# Patient Record
Sex: Female | Born: 1995 | Race: White | Hispanic: No | Marital: Single
Health system: Southern US, Community
[De-identification: ages and names within clinical notes are randomized; demographics above are authoritative.]

## PROBLEM LIST (undated history)

## (undated) DIAGNOSIS — J019 Acute sinusitis, unspecified: Secondary | ICD-10-CM

## (undated) DIAGNOSIS — J0391 Acute recurrent tonsillitis, unspecified: Secondary | ICD-10-CM

---
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
## 2016-01-26 ENCOUNTER — Inpatient Hospital Stay: Payer: BLUE CROSS/BLUE SHIELD

## 2016-01-26 LAB — POC URINE PREGNANCY TEST: PREGNANCY TEST, URINE: NEGATIVE

## 2016-01-26 MED ORDER — MIDAZOLAM 1 MG/ML IJ SOLN
1 mg/mL | INTRAMUSCULAR | Status: DC | PRN
Start: 2016-01-26 — End: 2016-01-26
  Administered 2016-01-26: 11:00:00 via INTRAVENOUS

## 2016-01-26 MED ORDER — BUPIVACAINE-EPINEPHRINE (PF) 0.25 %-1:200,000 IJ SOLN
0.25 %-1:200,000 | INTRAMUSCULAR | Status: DC | PRN
Start: 2016-01-26 — End: 2016-01-26
  Administered 2016-01-26: 12:00:00 via SUBCUTANEOUS

## 2016-01-26 MED ORDER — ROCURONIUM 10 MG/ML IV
10 mg/mL | INTRAVENOUS | Status: DC | PRN
Start: 2016-01-26 — End: 2016-01-26
  Administered 2016-01-26: 12:00:00 via INTRAVENOUS

## 2016-01-26 MED ORDER — SUCCINYLCHOLINE CHLORIDE 20 MG/ML INJECTION
20 mg/mL | INTRAMUSCULAR | Status: DC | PRN
Start: 2016-01-26 — End: 2016-01-26
  Administered 2016-01-26: 12:00:00 via INTRAVENOUS

## 2016-01-26 MED ORDER — LIDOCAINE (PF) 10 MG/ML (1 %) IJ SOLN
10 mg/mL (1 %) | Freq: Once | INTRAMUSCULAR | Status: DC | PRN
Start: 2016-01-26 — End: 2016-01-26

## 2016-01-26 MED ORDER — PROMETHAZINE 25 MG/ML INJECTION
25 mg/mL | INTRAMUSCULAR | Status: DC | PRN
Start: 2016-01-26 — End: 2016-01-26

## 2016-01-26 MED ORDER — ESMOLOL 10 MG/ML IV SOLN
100 mg/10 mL (10 mg/mL) | INTRAVENOUS | Status: DC | PRN
Start: 2016-01-26 — End: 2016-01-26
  Administered 2016-01-26 (×2): via INTRAVENOUS

## 2016-01-26 MED ORDER — ONDANSETRON (PF) 4 MG/2 ML INJECTION
4 mg/2 mL | Freq: Once | INTRAMUSCULAR | Status: AC
Start: 2016-01-26 — End: 2016-01-26
  Administered 2016-01-26: 11:00:00 via INTRAVENOUS

## 2016-01-26 MED ORDER — LABETALOL 5 MG/ML IV SOLN
5 mg/mL | INTRAVENOUS | Status: DC | PRN
Start: 2016-01-26 — End: 2016-01-26

## 2016-01-26 MED ORDER — MEPERIDINE (PF) 25 MG/ML INJ SOLUTION
25 mg/ml | INTRAMUSCULAR | Status: AC | PRN
Start: 2016-01-26 — End: 2016-01-26

## 2016-01-26 MED ORDER — HYDRALAZINE 20 MG/ML IJ SOLN
20 mg/mL | INTRAMUSCULAR | Status: DC | PRN
Start: 2016-01-26 — End: 2016-01-26

## 2016-01-26 MED ORDER — FENTANYL CITRATE (PF) 50 MCG/ML IJ SOLN
50 mcg/mL | INTRAMUSCULAR | Status: DC | PRN
Start: 2016-01-26 — End: 2016-01-26
  Administered 2016-01-26 (×3): via INTRAVENOUS

## 2016-01-26 MED ORDER — GLYCOPYRROLATE 0.2 MG/ML IJ SOLN
0.2 mg/mL | Freq: Once | INTRAMUSCULAR | Status: AC
Start: 2016-01-26 — End: 2016-01-26
  Administered 2016-01-26: 11:00:00 via INTRAVENOUS

## 2016-01-26 MED ORDER — PROPOFOL 10 MG/ML IV EMUL
10 mg/mL | INTRAVENOUS | Status: DC | PRN
Start: 2016-01-26 — End: 2016-01-26
  Administered 2016-01-26 (×2): via INTRAVENOUS

## 2016-01-26 MED ORDER — LACTATED RINGERS IV
INTRAVENOUS | Status: DC
Start: 2016-01-26 — End: 2016-01-26
  Administered 2016-01-26 (×3): via INTRAVENOUS

## 2016-01-26 MED ORDER — LIDOCAINE (PF) 20 MG/ML (2 %) IJ SOLN
20 mg/mL (2 %) | INTRAMUSCULAR | Status: DC | PRN
Start: 2016-01-26 — End: 2016-01-26
  Administered 2016-01-26: 12:00:00 via INTRAVENOUS

## 2016-01-26 MED ORDER — FLUMAZENIL 0.1 MG/ML IV SOLN
0.1 mg/mL | INTRAVENOUS | Status: DC | PRN
Start: 2016-01-26 — End: 2016-01-26

## 2016-01-26 MED ORDER — DIPHENHYDRAMINE HCL 50 MG/ML IJ SOLN
50 mg/mL | INTRAMUSCULAR | Status: DC | PRN
Start: 2016-01-26 — End: 2016-01-26
  Administered 2016-01-26: 12:00:00 via INTRAVENOUS

## 2016-01-26 MED ORDER — NALOXONE 0.4 MG/ML INJECTION
0.4 mg/mL | INTRAMUSCULAR | Status: DC | PRN
Start: 2016-01-26 — End: 2016-01-26

## 2016-01-26 MED ORDER — DEXAMETHASONE SODIUM PHOSPHATE 10 MG/ML IJ SOLN
10 mg/mL | Freq: Once | INTRAMUSCULAR | Status: AC
Start: 2016-01-26 — End: 2016-01-26
  Administered 2016-01-26: 11:00:00 via INTRAVENOUS

## 2016-01-26 MED ORDER — DIPHENHYDRAMINE HCL 50 MG/ML IJ SOLN
50 mg/mL | INTRAMUSCULAR | Status: DC | PRN
Start: 2016-01-26 — End: 2016-01-26

## 2016-01-26 MED ORDER — OXYCODONE-ACETAMINOPHEN 5 MG-325 MG TAB
5-325 mg | ORAL | Status: DC | PRN
Start: 2016-01-26 — End: 2016-01-26

## 2016-01-26 MED ORDER — HYDROCODONE-ACETAMINOPHEN 7.5 MG-325 MG/15 ML ORAL SOLN
ORAL | Status: DC | PRN
Start: 2016-01-26 — End: 2016-01-26
  Administered 2016-01-26: 13:00:00 via ORAL

## 2016-01-26 MED ORDER — FENTANYL CITRATE (PF) 50 MCG/ML IJ SOLN
50 mcg/mL | INTRAMUSCULAR | Status: DC | PRN
Start: 2016-01-26 — End: 2016-01-26

## 2016-01-26 MED FILL — PROMETHAZINE 25 MG/ML INJECTION: 25 mg/mL | INTRAMUSCULAR | Qty: 0.25

## 2016-01-26 MED FILL — DEXAMETHASONE SODIUM PHOSPHATE 10 MG/ML IJ SOLN: 10 mg/mL | INTRAMUSCULAR | Qty: 0.8

## 2016-01-26 MED FILL — FLUMAZENIL 0.1 MG/ML IV SOLN: 0.1 mg/mL | INTRAVENOUS | Qty: 2

## 2016-01-26 MED FILL — LACTATED RINGERS IV: INTRAVENOUS | Qty: 1000

## 2016-01-26 MED FILL — GLYCOPYRROLATE 0.2 MG/ML IJ SOLN: 0.2 mg/mL | INTRAMUSCULAR | Qty: 1

## 2016-01-26 MED FILL — LABETALOL 5 MG/ML IV SOLN: 5 mg/mL | INTRAVENOUS | Qty: 2

## 2016-01-26 MED FILL — DIPHENHYDRAMINE HCL 50 MG/ML IJ SOLN: 50 mg/mL | INTRAMUSCULAR | Qty: 0.25

## 2016-01-26 MED FILL — FENTANYL CITRATE (PF) 50 MCG/ML IJ SOLN: 50 mcg/mL | INTRAMUSCULAR | Qty: 0.5

## 2016-01-26 MED FILL — DEMEROL (PF) 25 MG/ML INJECTION SYRINGE: 25 mg/mL | INTRAMUSCULAR | Qty: 1

## 2016-01-26 MED FILL — ONDANSETRON (PF) 4 MG/2 ML INJECTION: 4 mg/2 mL | INTRAMUSCULAR | Qty: 2

## 2016-01-26 MED FILL — NALOXONE 0.4 MG/ML INJECTION: 0.4 mg/mL | INTRAMUSCULAR | Qty: 0.25

## 2016-01-26 MED FILL — HYDRALAZINE 20 MG/ML IJ SOLN: 20 mg/mL | INTRAMUSCULAR | Qty: 0.25

## 2016-01-26 MED FILL — OXYCODONE-ACETAMINOPHEN 5 MG-325 MG TAB: 5-325 mg | ORAL | Qty: 2

## 2016-01-26 MED FILL — XYLOCAINE-MPF 10 MG/ML (1 %) INJECTION SOLUTION: 10 mg/mL (1 %) | INTRAMUSCULAR | Qty: 1

## 2016-01-26 MED FILL — HYDROCODONE-ACETAMINOPHEN 7.5 MG-325 MG/15 ML ORAL SOLN: ORAL | Qty: 30

## 2016-01-26 NOTE — Other (Signed)
[id number].  Awake and alert. C/o of feeling cool.  Heat lamps applied.

## 2016-01-26 NOTE — Anesthesia Post-Procedure Evaluation (Signed)
Post-Anesthesia Evaluation and Assessment    Patient: Linda PitcherRachel J Manning MRN: [id number]  SSN: [id number]    Date of Birth: May 05, 1996  Age: 20 y.o.  Sex: female       Cardiovascular Function/Vital Signs  Visit Vitals   ??? BP 112/76   ??? Pulse 70   ??? Temp 36.7 ??C (98.1 ??F)   ??? Resp 16   ??? Ht 5\' 5"  (1.651 m)   ??? Wt 53 kg (116 lb 13.5 oz)   ??? SpO2 100%   ??? BMI 19.44 kg/m2       Patient is status post general anesthesia for Procedure(s):  TONSILLECTOMY.    Nausea/Vomiting: None    Postoperative hydration reviewed and adequate.    Pain:  Pain Scale 1: Numeric (0 - 10) (01/26/16 1005)  Pain Intensity 1: 3 (01/26/16 1005)   Managed    Neurological Status:   Neuro (WDL): Within Defined Limits (01/26/16 0946)   At baseline    Mental Status and Level of Consciousness: Arousable    Pulmonary Status:   O2 Device: Room air (01/26/16 1005)   Adequate oxygenation and airway patent    Complications related to anesthesia: None    Post-anesthesia assessment completed. No concerns    Signed By: Alric SetonMelvin C Marget Outten, MD     Jan 26, 2016

## 2016-01-26 NOTE — H&P (Signed)
Pre-op History and Physical        Otolaryngology    Patient: Linda PitcherRachel J Manning MRN: [id number]  SSN: [id number]    Date of Birth: 17-Aug-1996  Age: 20 y.o.  Sex: female       01/26/2016    Assessment:     Linda Manning is a 20 y.o. female who is presenting with recurrent acute tonsillitis and chronic tonsillitis.       HPI:   20 y.o. female who is presenting with recurrent acute tonsillitis and chronic tonsillitis.       Allergies:   Not on File    Medications:    No current facility-administered medications for this encounter.     Past Medical History:   No past medical history on file.    Past Surgical History:   No past surgical history on file.    Family History:   No family history on file.    Social History:     Social History   Substance Use Topics   ??? Smoking status: Not on file   ??? Smokeless tobacco: Not on file   ??? Alcohol use Not on file       Review of Systems:   Negative except sore throat.    Physical Exam:   There were no vitals taken for this visit.    General appearance: alert, cooperative, no distress, appears stated age  Head: Normocephalic, without obvious abnormality, atraumatic  Ears: normal TM's and external ear canals AU  Nose: Nares normal. Septum midline. Mucosa normal. No drainage or sinus tenderness.  Throat: Lips, mucosa, and tongue normal. Teeth and gums normal  Neck: no adenopathy, no mass  Lungs: No wheeze  Heart: No cyanosis      Labs:         Imaging:   @IMAGES @        Impression:     20 y.o. female who is presenting with recurrent acute tonsillitis and chronic tonsillitis.       Plan:     1)Tonsillectomy          Glendora Scoreyan P Citlalli Weikel, MD  @[facility] 

## 2016-01-26 NOTE — Op Note (Signed)
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    ANESTHESIA: General    COMPLICATIONS:  None    BLOOD LOSS:  Minimal     FINDINGS: chronic tonsillitis    INDICATIONS FOR SURGERY:  The patient is a 20 y.o. female with a history of chronic and recurrent tonsillitis.    PROCEDURE DETAILS:  After informed consent, the patient was taken to the operating room and placed on the table in the supine position.  After general anesthesia, the table was turned 90 degrees and the patient was prepped and draped in the standard fashion.  A Crowe-Davis mouth retractor was inserted into the mouth, opened, and suspended from the Mayo stand.  Using an Allis clamp, the right tonsil was retracted toward the midline.  The bovie electrocautery, at the appropriate setting, was used to enter the tonsillar capsule through the anterior pillar and the capsule was followed around the tonsil while retracting the tonsil medially to save the posterior pillar mucosa.   Minor bleeding was controlled with the coagulation function of the Bovie.  The left tonsil was removed in the same fashion.     Conservative prophylactic cautery was performed in both poles bilaterally.  There was no bleeding seen at this point.  At this point the oropharynx was irrigated with normal saline.  The mouth retractor was taken down for a short period and then re-suspended.  No bleeding was seen at this point.  I then injected 10cc of 0.25% plain marcaine into the tonsillar fossae. I then evacuated the stomach with an orogastric tube. The patient was then awakened from anesthesia, extubated,  and taken to the PACU in stable condition.  There were no complications.  The patient tolerated the procedure well.    Glendora Scoreyan P Zamire Whitehurst, MD  01/26/2016  8:02 AM

## 2016-01-26 NOTE — Anesthesia Pre-Procedure Evaluation (Signed)
Anesthetic History   No history of anesthetic complications            Review of Systems / Medical History  Patient summary reviewed, nursing notes reviewed and pertinent labs reviewed    Pulmonary  Within defined limits                 Neuro/Psych   Within defined limits           Cardiovascular  Within defined limits                     GI/Hepatic/Renal  Within defined limits              Endo/Other  Within defined limits           Other Findings              Physical Exam    Airway  Mallampati: I  TM Distance: 4 - 6 cm  Neck ROM: normal range of motion   Mouth opening: Normal     Cardiovascular  Regular rate and rhythm,  S1 and S2 normal,  no murmur, click, rub, or gallop             Dental  No notable dental hx       Pulmonary  Breath sounds clear to auscultation               Abdominal  GI exam deferred       Other Findings            Anesthetic Plan    ASA: 1  Anesthesia type: general          Induction: Intravenous  Anesthetic plan and risks discussed with: Patient

## 2016-01-26 NOTE — Other (Signed)
0900 Patients mother, Clement HusbandsJoyce Speers, updated in waiting room.

## 2016-01-26 NOTE — Op Note (Signed)
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    ANESTHESIA: General    COMPLICATIONS:  None    BLOOD LOSS:  Minimal     FINDINGS: chronic tonsillitis    INDICATIONS FOR SURGERY:  The patient is a 20 y.o. female with a history of chronic and recurrent tonsillitis.    PROCEDURE DETAILS:  After informed consent, the patient was taken to the operating room and placed on the table in the supine position.  After general anesthesia, the table was turned 90 degrees and the patient was prepped and draped in the standard fashion.  A Crowe-Davis mouth retractor was inserted into the mouth, opened, and suspended from the Mayo stand.  Using an Allis clamp, the right tonsil was retracted toward the midline.  The bovie electrocautery, at the appropriate setting, was used to enter the tonsillar capsule through the anterior pillar and the capsule was followed around the tonsil while retracting the tonsil medially to save the posterior pillar mucosa.   Minor bleeding was controlled with the coagulation function of the Bovie.  The left tonsil was removed in the same fashion.     Conservative prophylactic cautery was performed in both poles bilaterally.  There was no bleeding seen at this point.  At this point the oropharynx was irrigated with normal saline.  The mouth retractor was taken down for a short period and then re-suspended.  No bleeding was seen at this point.  I then injected 10cc of 0.25% plain marcaine into the tonsillar fossae. I then evacuated the stomach with an orogastric tube. The patient was then awakened from anesthesia, extubated, and taken to the PACU in stable condition.   There were no complications.  The patient tolerated the procedure well.    Glendora Scoreyan P Bereket Gernert, MD  01/26/2016  8:02 AM

## 2018-03-22 ENCOUNTER — Emergency Department (HOSPITAL_COMMUNITY): Payer: BLUE CROSS/BLUE SHIELD

## 2018-03-22 ENCOUNTER — Emergency Department (HOSPITAL_COMMUNITY)
Admission: EM | Admit: 2018-03-22 | Discharge: 2018-03-22 | Disposition: A | Discharge status: Home / Self Care | Payer: BLUE CROSS/BLUE SHIELD | Attending: Emergency Medicine | Admitting: Emergency Medicine

## 2018-03-22 ENCOUNTER — Encounter (HOSPITAL_COMMUNITY): Payer: Self-pay

## 2018-03-22 ENCOUNTER — Other Ambulatory Visit: Payer: Self-pay

## 2018-03-22 DIAGNOSIS — Y93E1 Activity, personal bathing and showering: Secondary | ICD-10-CM | POA: Diagnosis not present

## 2018-03-22 DIAGNOSIS — Y92002 Bathroom of unspecified non-institutional (private) residence single-family (private) house as the place of occurrence of the external cause: Secondary | ICD-10-CM | POA: Diagnosis not present

## 2018-03-22 DIAGNOSIS — S91115A Laceration without foreign body of left lesser toe(s) without damage to nail, initial encounter: Secondary | ICD-10-CM | POA: Diagnosis not present

## 2018-03-22 DIAGNOSIS — S6982XA Other specified injuries of left wrist, hand and finger(s), initial encounter: Secondary | ICD-10-CM | POA: Diagnosis present

## 2018-03-22 DIAGNOSIS — S61512A Laceration without foreign body of left wrist, initial encounter: Secondary | ICD-10-CM | POA: Insufficient documentation

## 2018-03-22 DIAGNOSIS — W25XXXA Contact with sharp glass, initial encounter: Secondary | ICD-10-CM | POA: Diagnosis not present

## 2018-03-22 DIAGNOSIS — Y999 Unspecified external cause status: Secondary | ICD-10-CM | POA: Diagnosis not present

## 2018-03-22 MED ORDER — CEPHALEXIN 250 MG PO CAPS: 250.0000 mg | ORAL_CAPSULE | Freq: Two times a day (BID) | ORAL | 0 refills | Status: AC

## 2018-03-22 MED ORDER — LIDOCAINE HCL (PF) 1 % IJ SOLN
30.0000 mL | Freq: Once | INTRAMUSCULAR | Status: AC
  Administered 2018-03-22: 30 mL

## 2018-03-22 NOTE — ED Notes (Signed)
Pt ready for discharge VS documented 

## 2018-03-22 NOTE — Discharge Instructions (Addendum)
1. Medications: Tylenol or ibuprofen for pain. You may take up to 800 mg (4 pills) at a time of ibuprofen, 3 times a day with meals. Take antibiotics as prescribed.  2. Treatment: ice for swelling, keep wound clean with warm soap and water and keep bandage dry 3. Follow Up: Please return in 10 days to have your stitches/staples removed or sooner if you have concerns. Return to the emergency department for increased redness, drainage of pus from the wound   WOUND CARE  Remove bandage and wash wound gently with mild soap and warm water daily. Reapply a new bandage after cleaning wound.   Do not apply any ointments or creams to the wound while stitches are in place, as this may cause delayed healing. Return if you experience any of the following signs of infection: Swelling, redness, pus drainage, streaking, fever >101.0 F  Return if you experience excessive bleeding that does not stop after 15-20 minutes of constant, firm pressure.

## 2018-03-22 NOTE — ED Notes (Signed)
Ortho paged for post op shoe due to size not available in ED stock room

## 2018-03-22 NOTE — ED Provider Notes (Signed)
[facility]ENC[facility] Provider Note   CSN: [id number] Arrival date & time: 03/22/18  1907     History   Chief Complaint Chief Complaint  Patient presents with  . Laceration    HPI Lydia Smith is a 22 y.o. female presenting for evaluation of wrist and toe laceration.  Patient states she was getting out of shower when the last door went off the track and shattered on the floor.  She obtained a laceration of her left radial wrist and her left pinky toe.  She was seen at urgent care, and sent to the emergency room for further evaluation.  She reports continued numbness of her distal left pinky toe.  She denies numbness or difficulty moving her left hand.  She has no medical problems, takes no medications daily.  She is not on blood thinners.  Tetanus was updated at urgent care.  She denies significant pain at this time.  She denies injury elsewhere.  HPI  History reviewed. No pertinent past medical history.  There are no active problems to display for this patient.   History reviewed. No pertinent surgical history.   OB History    Gravida  1   Para      Term      Preterm      AB      Living        SAB      TAB      Ectopic      Multiple      Live Births               Home Medications    Prior to Admission medications   Medication Sig Start Date End Date Taking? Authorizing Provider  cephALEXin (KEFLEX) 250 MG capsule Take 1 capsule (250 mg total) by mouth 2 (two) times daily for 3 days. 03/22/18 03/25/18  Keylee Shrestha, PA-C    Family History No family history on file.  Social History Social History   Tobacco Use  . Smoking status: Not on file  Substance Use Topics  . Alcohol use: Not on file  . Drug use: Not on file     Allergies   Amoxicillin   Review of Systems Review of Systems  Skin: Positive for wound.  Neurological: Negative for numbness.  Hematological: Does not bruise/bleed easily.      Physical Exam Updated Vital Signs BP 110/68   Pulse 80   Temp 99.1 F (37.3 C) (Oral)   Resp 18   Ht 5' 5.5" (1.664 m)   Wt 55.3 kg (122 lb)   LMP 03/08/2018 (Exact Date)   SpO2 97%   BMI 19.99 kg/m   Physical Exam  Constitutional: She is oriented to person, place, and time. She appears well-developed and well-nourished. No distress.  HENT:  Head: Normocephalic and atraumatic.  Eyes: EOM are normal.  Neck: Normal range of motion.  Pulmonary/Chest: Effort normal.  Abdominal: She exhibits no distension.  Musculoskeletal: Normal range of motion. She exhibits tenderness.  Full active range of motion of the left hand without difficulty.  No pain at the anatomic snuffbox.  Small puncture wound at the distal aspect of the radius.  Small contusion underlying the laceration.  No lacerations noted elsewhere in the hand.  No active bleeding. 1 cm laceration of the medial aspect of the left fifth toe without active bleeding overlying the IP joint.  No active bleeding.  Patient reports decreased sensation of the distal toe.  Good cap refill.  No difficulty with abduction of the toe  Neurological: She is alert and oriented to person, place, and time.  Skin: Skin is warm. Capillary refill takes less than 2 seconds. No rash noted.  Psychiatric: She has a normal mood and affect.  Nursing note and vitals reviewed.    ED Treatments / Results  Labs (all labs ordered are listed, but only abnormal results are displayed) Labs Reviewed - No data to display  EKG None  Radiology Dg Wrist Complete Left  Result Date: 03/22/2018 CLINICAL DATA:  Laceration of the left wrist. EXAM: LEFT WRIST - COMPLETE 3+ VIEW COMPARISON:  None. FINDINGS: There is no evidence of fracture or dislocation. There is no evidence of arthropathy or other focal bone abnormality. Soft tissues are unremarkable. IMPRESSION: Negative. Electronically Signed   By: Sherian Rein M.D.   On: 03/22/2018 20:29   Dg Foot Complete  Left  Result Date: 03/22/2018 CLINICAL DATA:  Laceration to the dorsal is purpose of left fifth toe. EXAM: LEFT FOOT - COMPLETE 3+ VIEW COMPARISON:  None. FINDINGS: There is no evidence of fracture or dislocation. There is no evidence of arthropathy or other focal bone abnormality. Soft tissues are unremarkable. Overlying artifact is identified at the left fifth toe. IMPRESSION: Overlying artifact identified superficially of the left fifth toe. No bony abnormality noted. Electronically Signed   By: Sherian Rein M.D.   On: 03/22/2018 20:28    Procedures .Marland KitchenLaceration Repair Date/Time: 03/22/2018 10:22 PM Performed by: Alveria Apley, PA-C Authorized by: Alveria Apley, PA-C   Consent:    Consent obtained:  Verbal   Consent given by:  Patient   Risks discussed:  Infection, pain, poor cosmetic result and poor wound healing Anesthesia (see MAR for exact dosages):    Anesthesia method:  Local infiltration   Local anesthetic:  Lidocaine 1% w/o epi Laceration details:    Location:  Toe   Toe location:  L little toe   Length (cm):  1   Depth (mm):  4 Repair type:    Repair type:  Simple Pre-procedure details:    Preparation:  Patient was prepped and draped in usual sterile fashion and imaging obtained to evaluate for foreign bodies Exploration:    Wound exploration: wound explored through full range of motion and entire depth of wound probed and visualized     Wound extent: nerve damage     Wound extent: no foreign bodies/material noted, no tendon damage noted and no underlying fracture noted   Treatment:    Area cleansed with:  Shur-Clens   Amount of cleaning:  Extensive Skin repair:    Repair method:  Sutures   Suture size:  4-0   Suture material:  Prolene   Suture technique:  Simple interrupted   Number of sutures:  3 Approximation:    Approximation:  Close Post-procedure details:    Dressing: buddy tape.   Patient tolerance of procedure:  Tolerated well, no immediate  complications .Marland KitchenLaceration Repair Date/Time: 03/22/2018 10:23 PM Performed by: Alveria Apley, PA-C Authorized by: Alveria Apley, PA-C   Consent:    Consent obtained:  Verbal   Consent given by:  Patient   Risks discussed:  Pain, poor cosmetic result, poor wound healing and infection Anesthesia (see MAR for exact dosages):    Anesthesia method:  None Laceration details:    Location:  Hand   Hand location:  L wrist   Length (cm):  0.2   Laceration depth: puncture. Repair type:  Repair type:  Simple Pre-procedure details:    Preparation:  Imaging obtained to evaluate for foreign bodies Exploration:    Wound extent: no foreign bodies/material noted and no underlying fracture noted   Treatment:    Area cleansed with:  Shur-Clens   Amount of cleaning:  Extensive Skin repair:    Repair method:  Steri-Strips   Number of Steri-Strips:  3 Approximation:    Approximation:  Close Post-procedure details:    Dressing:  Sterile dressing   Patient tolerance of procedure:  Tolerated well, no immediate complications   (including critical care time)  Medications Ordered in ED Medications  lidocaine (PF) (XYLOCAINE) 1 % injection 30 mL (30 mLs Infiltration Given by Other 03/22/18 2158)     Initial Impression / Assessment and Plan / ED Course  I have reviewed the triage vital signs and the nursing notes.  Pertinent labs & imaging results that were available during my care of the patient were reviewed by me and considered in my medical decision making (see chart for details).     Patient presenting for evaluation of left wrist and left pinky wounds after the glass door broke.  Physical exam reassuring on the hand, patient is neurovascularly intact.  X-rays of hand and foot viewed interpreted by me, no fractures or dislocations.  Left little toe with decreased sensation, possible nerve injury.  ? Nerve damage when exploring the wound. However patient with good cap refill and no  underlying fracture.  As this is of the little toe, will have patient follow-up with podiatry instead of consulting tonight.  Wounds cleaned extensively and repaired.  Aftercare instructions given.  Will prophylactically placed on Keflex due to puncture wound and location of wounds.  At this time, patient appears safe for discharge.  Return precautions given.  Patient states she understands and agrees plan.   Final Clinical Impressions(s) / ED Diagnoses   Final diagnoses:  Laceration of left wrist, initial encounter  Laceration of lesser toe of left foot without foreign body without damage to nail, initial encounter    ED Discharge Orders        Ordered    cephALEXin (KEFLEX) 250 MG capsule  2 times daily     03/22/18 2156       Alveria Apley, PA-C 03/22/18 2226    Arby Barrette, MD 03/22/18 2242

## 2018-03-22 NOTE — ED Triage Notes (Signed)
Patient closed a glass shower door, came off the track and the door shattered. Patient has lac to left wrist and left foot. [facility] and was sent here for follow up (stitches, possible xray, specialist).

## 2019-12-18 IMAGING — CR DG FOOT COMPLETE 3+V*L*
3 series · 3 of 3 positions shown · non-contrast
Comparison: None.

CLINICAL DATA: Laceration to the dorsal is purpose of left fifth
toe.

EXAM:
LEFT FOOT - COMPLETE 3+ VIEW

[foot ap]
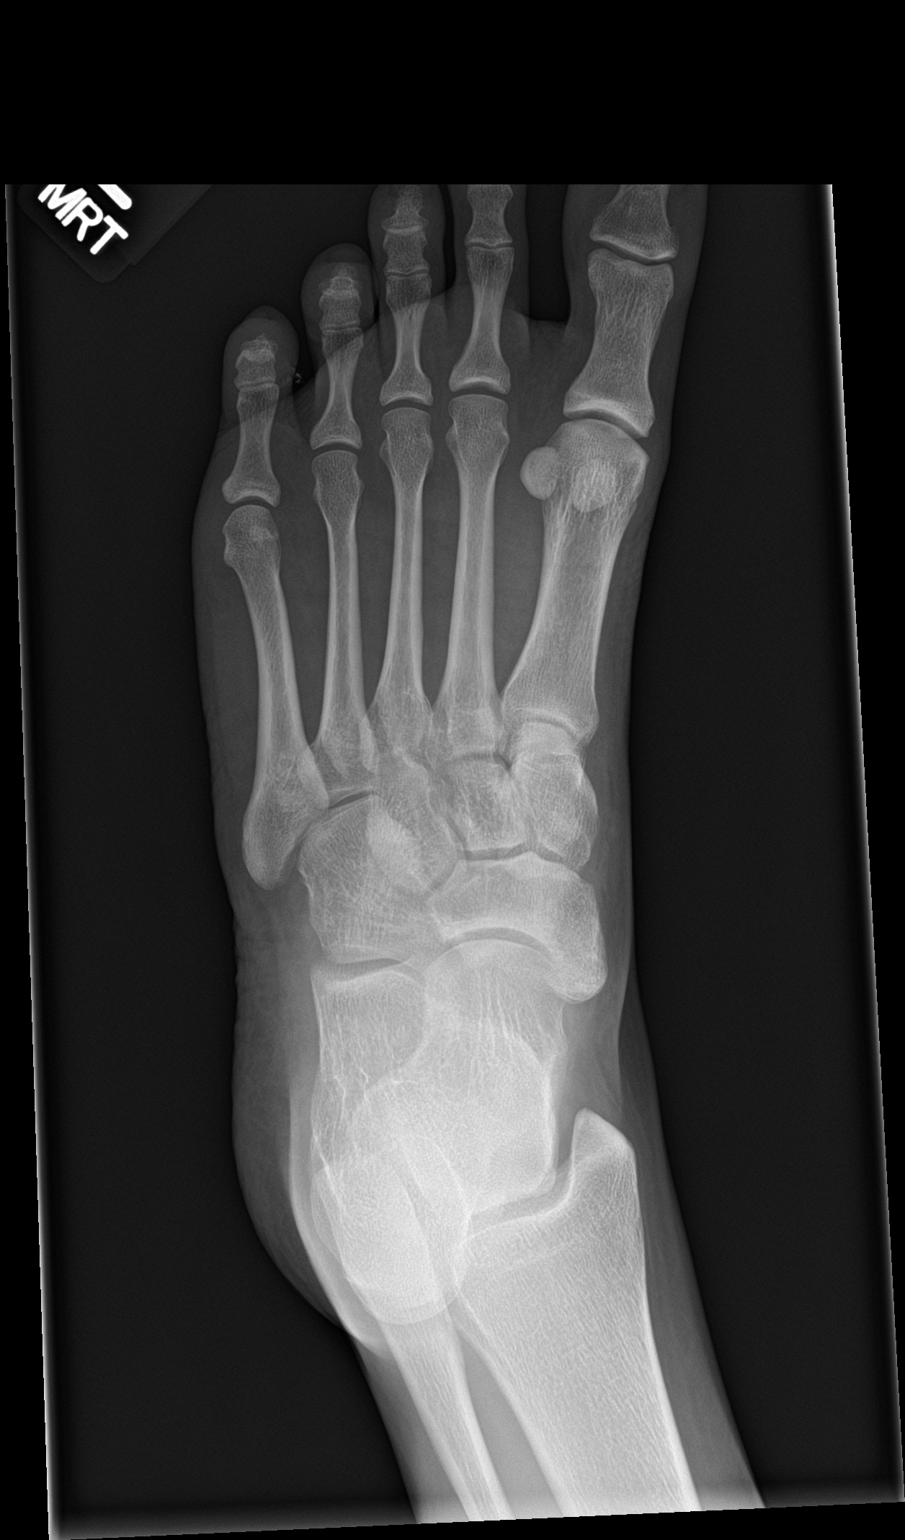

[foot obl]
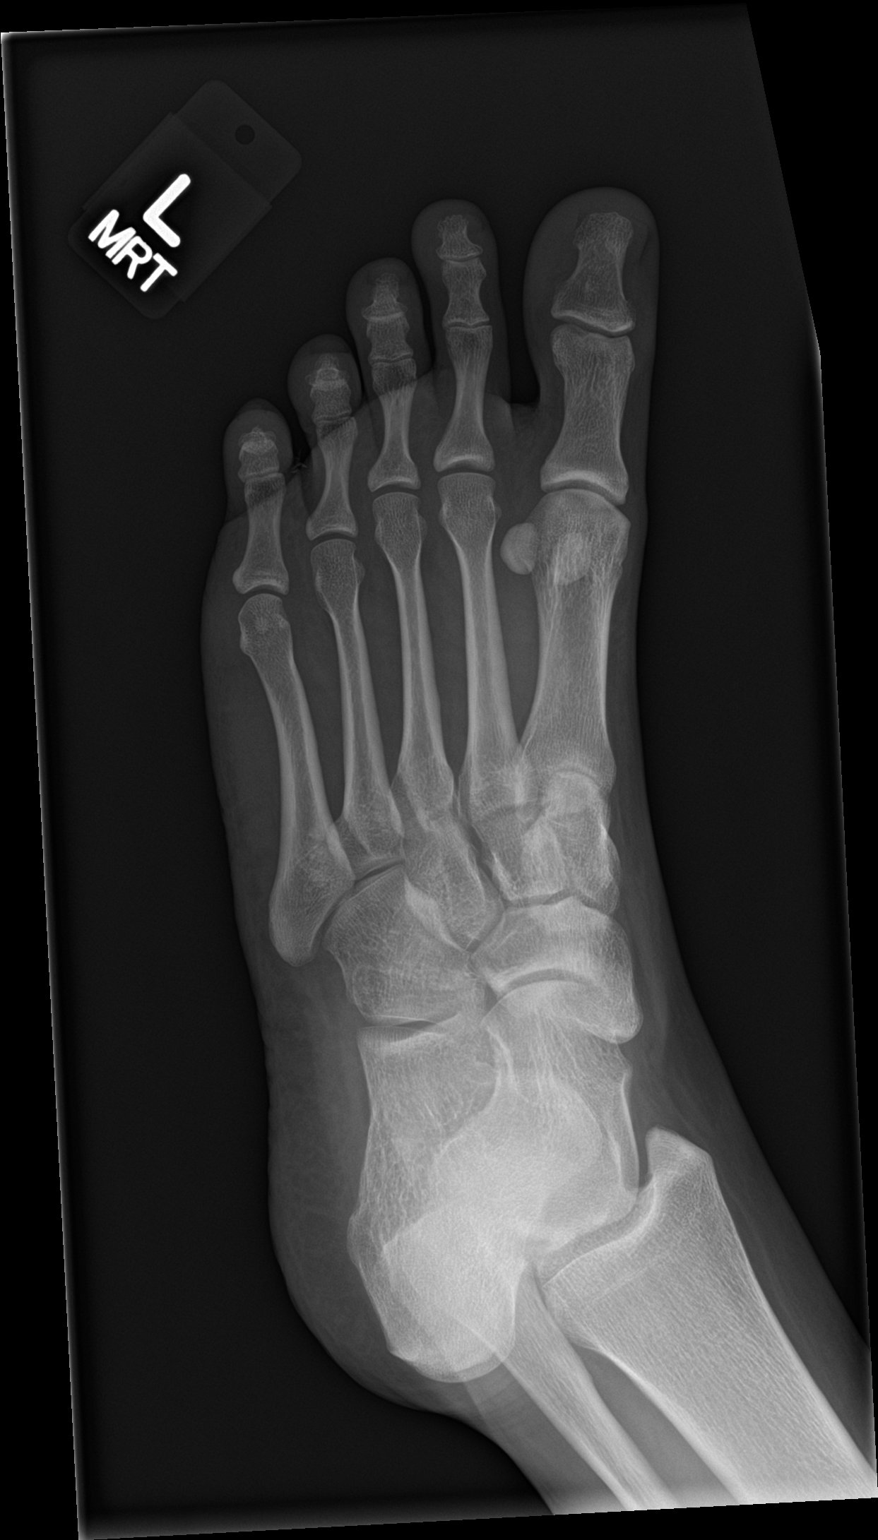

[foot lat]
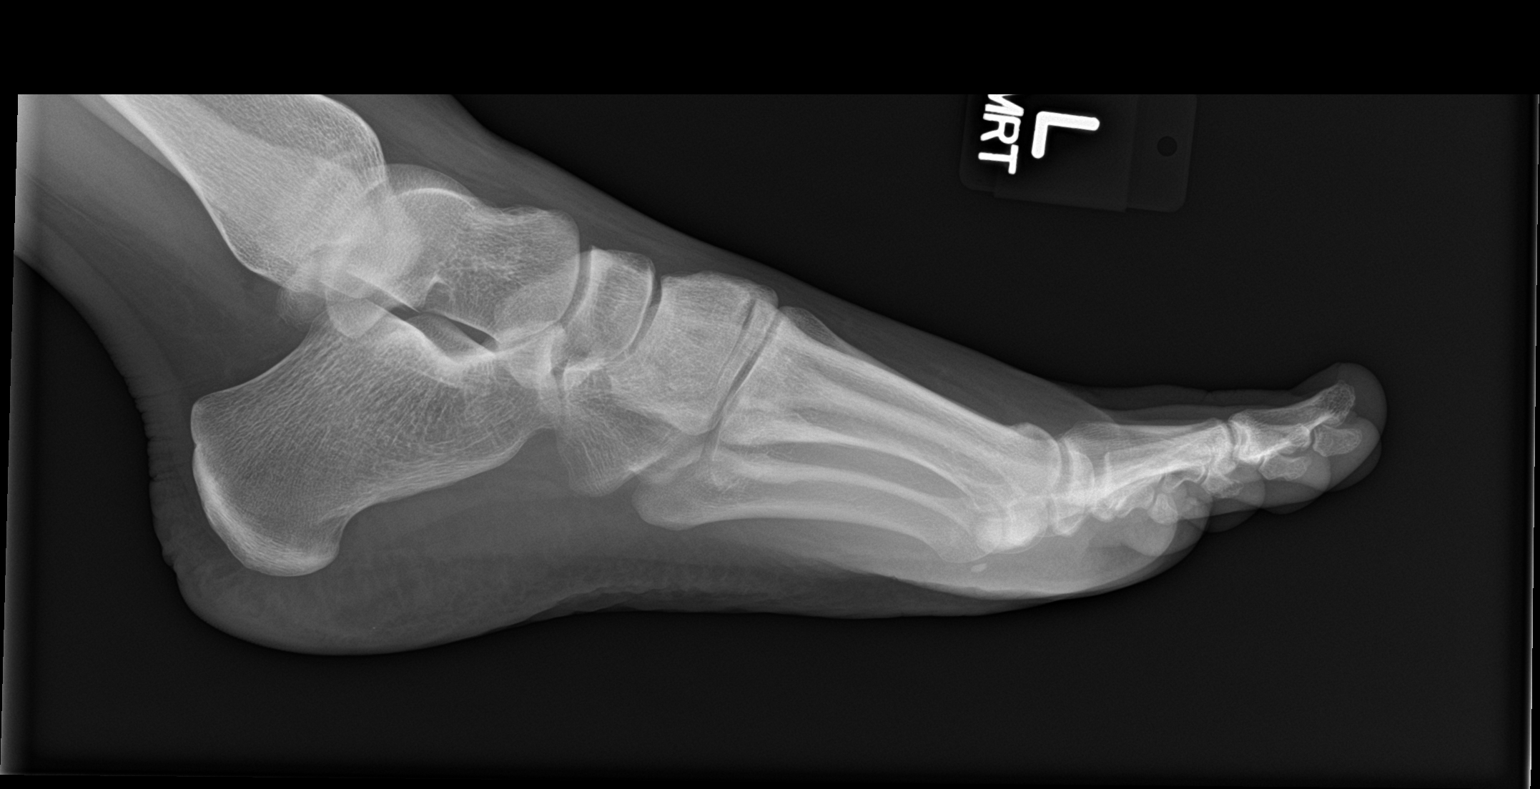

[3 of 3 positions shown; findings below may reference images not displayed]

FINDINGS: There is no evidence of fracture or dislocation. There is no
evidence of arthropathy or other focal bone abnormality. Soft
tissues are unremarkable. Overlying artifact is identified at the
left fifth toe.
IMPRESSION: Overlying artifact identified superficially of the left fifth toe.
No bony abnormality noted.
